# Patient Record
Sex: Female | Born: 1970 | Race: White | Hispanic: No | Marital: Married | Smoking: Former smoker
Health system: Southern US, Community
[De-identification: ages and names within clinical notes are randomized; demographics above are authoritative.]

## PROBLEM LIST (undated history)

## (undated) DIAGNOSIS — D229 Melanocytic nevi, unspecified: Secondary | ICD-10-CM

## (undated) DIAGNOSIS — K589 Irritable bowel syndrome without diarrhea: Secondary | ICD-10-CM

## (undated) DIAGNOSIS — J020 Streptococcal pharyngitis: Secondary | ICD-10-CM

## (undated) HISTORY — DX: Irritable bowel syndrome without diarrhea: K58.9

## (undated) HISTORY — DX: Streptococcal pharyngitis: J02.0

---
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
## 2002-10-18 ENCOUNTER — Other Ambulatory Visit: Admission: RE | Admit: 2002-10-18 | Discharge: 2002-10-18 | Payer: Self-pay | Admitting: *Deleted

## 2003-07-12 ENCOUNTER — Encounter: Admission: RE | Admit: 2003-07-12 | Discharge: 2003-07-12 | Payer: Self-pay | Admitting: *Deleted

## 2003-07-14 ENCOUNTER — Inpatient Hospital Stay (HOSPITAL_COMMUNITY): Admission: AD | Admit: 2003-07-14 | Discharge: 2003-07-14 | Payer: Self-pay | Admitting: *Deleted

## 2003-08-31 ENCOUNTER — Inpatient Hospital Stay (HOSPITAL_COMMUNITY): Admission: AD | Admit: 2003-08-31 | Discharge: 2003-09-01 | Payer: Self-pay | Admitting: Obstetrics and Gynecology

## 2003-09-02 ENCOUNTER — Inpatient Hospital Stay (HOSPITAL_COMMUNITY): Admission: AD | Admit: 2003-09-02 | Discharge: 2003-09-02 | Payer: Self-pay | Admitting: Obstetrics and Gynecology

## 2003-09-05 ENCOUNTER — Inpatient Hospital Stay (HOSPITAL_COMMUNITY): Admission: AD | Admit: 2003-09-05 | Discharge: 2003-09-05 | Payer: Self-pay | Admitting: Obstetrics and Gynecology

## 2003-09-07 ENCOUNTER — Inpatient Hospital Stay (HOSPITAL_COMMUNITY): Admission: AD | Admit: 2003-09-07 | Discharge: 2003-09-11 | Payer: Self-pay | Admitting: Obstetrics and Gynecology

## 2003-09-08 ENCOUNTER — Encounter (INDEPENDENT_AMBULATORY_CARE_PROVIDER_SITE_OTHER): Payer: Self-pay | Admitting: Specialist

## 2003-09-13 ENCOUNTER — Encounter: Admission: RE | Admit: 2003-09-13 | Discharge: 2003-10-13 | Payer: Self-pay | Admitting: Obstetrics and Gynecology

## 2003-10-17 ENCOUNTER — Other Ambulatory Visit: Admission: RE | Admit: 2003-10-17 | Discharge: 2003-10-17 | Payer: Self-pay | Admitting: Obstetrics and Gynecology

## 2004-01-04 DIAGNOSIS — D229 Melanocytic nevi, unspecified: Secondary | ICD-10-CM

## 2004-01-04 HISTORY — DX: Melanocytic nevi, unspecified: D22.9

## 2004-12-25 ENCOUNTER — Ambulatory Visit: Payer: Self-pay | Admitting: Internal Medicine

## 2005-01-01 ENCOUNTER — Ambulatory Visit: Payer: Self-pay | Admitting: Internal Medicine

## 2005-01-17 ENCOUNTER — Other Ambulatory Visit: Admission: RE | Admit: 2005-01-17 | Discharge: 2005-01-17 | Payer: Self-pay | Admitting: Obstetrics and Gynecology

## 2005-12-05 ENCOUNTER — Encounter: Admission: RE | Admit: 2005-12-05 | Discharge: 2005-12-05 | Payer: Self-pay | Admitting: Obstetrics and Gynecology

## 2006-01-02 ENCOUNTER — Inpatient Hospital Stay (HOSPITAL_COMMUNITY): Admission: AD | Admit: 2006-01-02 | Discharge: 2006-01-02 | Payer: Self-pay | Admitting: Obstetrics & Gynecology

## 2006-02-07 ENCOUNTER — Inpatient Hospital Stay (HOSPITAL_COMMUNITY): Admission: AD | Admit: 2006-02-07 | Discharge: 2006-02-09 | Payer: Self-pay | Admitting: Obstetrics and Gynecology

## 2006-05-26 ENCOUNTER — Ambulatory Visit: Payer: Self-pay | Admitting: Internal Medicine

## 2007-02-07 ENCOUNTER — Encounter: Payer: Self-pay | Admitting: Internal Medicine

## 2007-02-07 DIAGNOSIS — K589 Irritable bowel syndrome without diarrhea: Secondary | ICD-10-CM

## 2007-02-07 HISTORY — DX: Irritable bowel syndrome, unspecified: K58.9

## 2007-12-17 IMAGING — US US FETAL BPP W/O NONSTRESS
1 series · 14 of 26 positions shown · non-contrast
Comparison: none

CLINICAL DATA: 33.2 weeks pregnant, contractions, fetal deceleration, abnormal NST in office.

[Series 1: us fetal bpp w/o nonstress · 0.35mm/px · 14 of 26 slices shown]
[im 1/26]
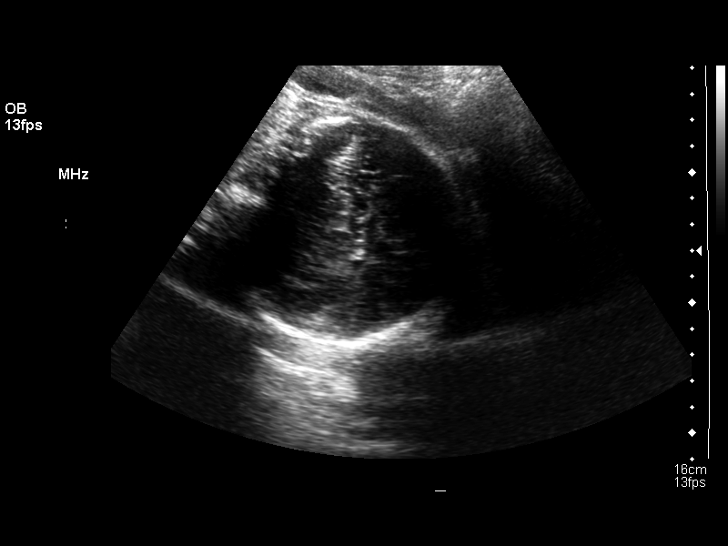
[im 3/26]
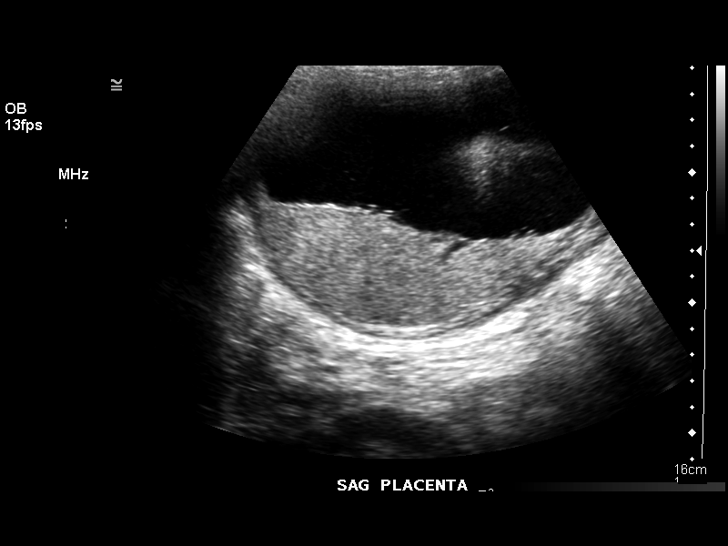
[im 5/26]
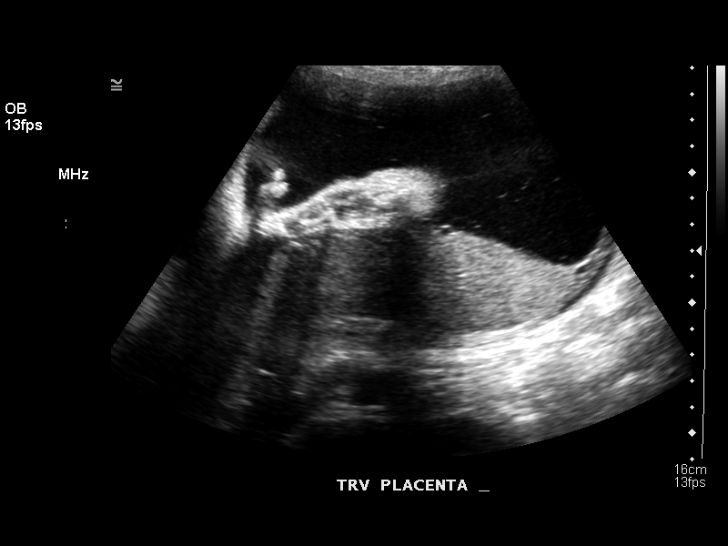
[im 7/26]
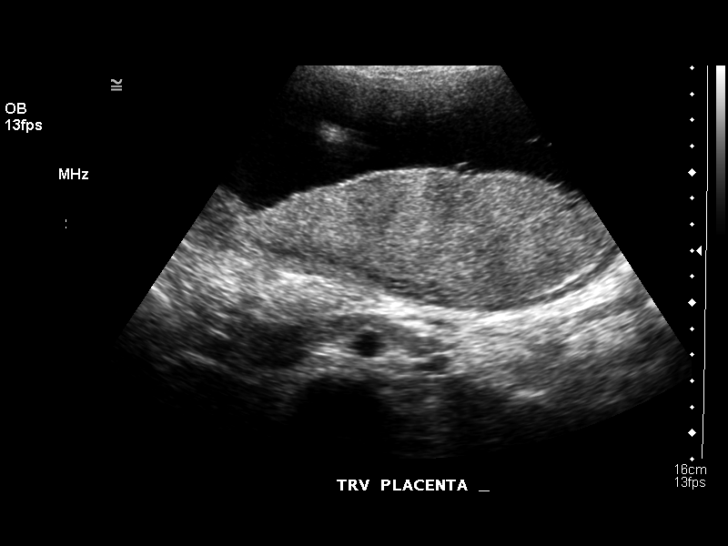
[im 9/26]
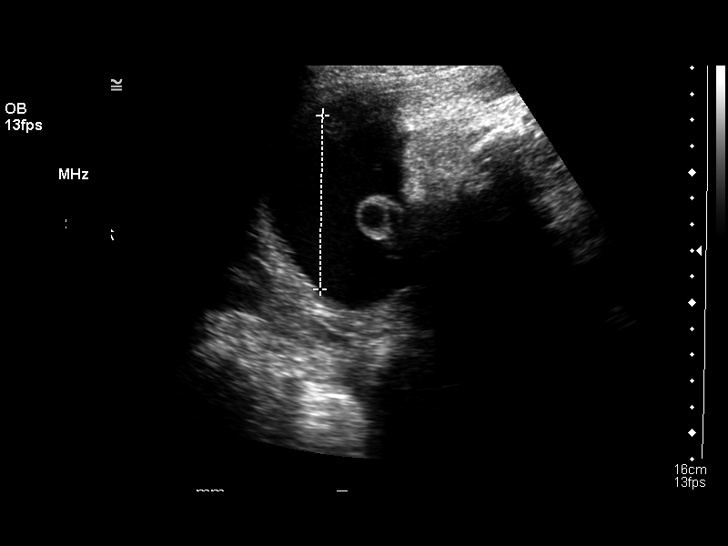
[im 11/26]
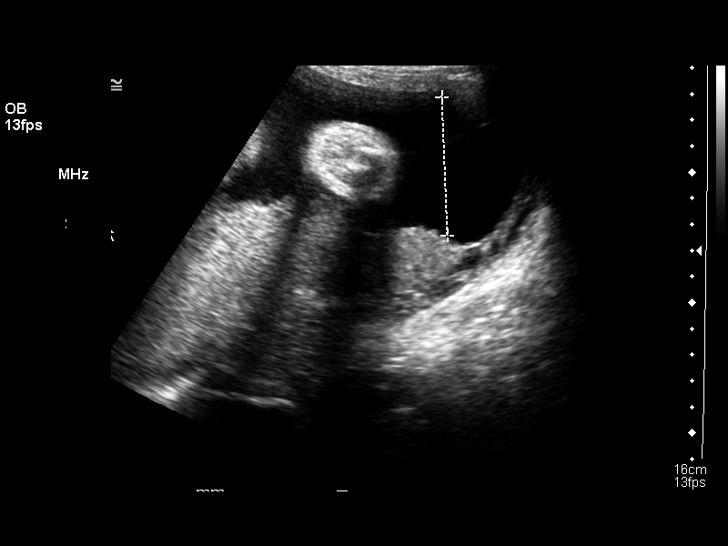
[im 13/26]
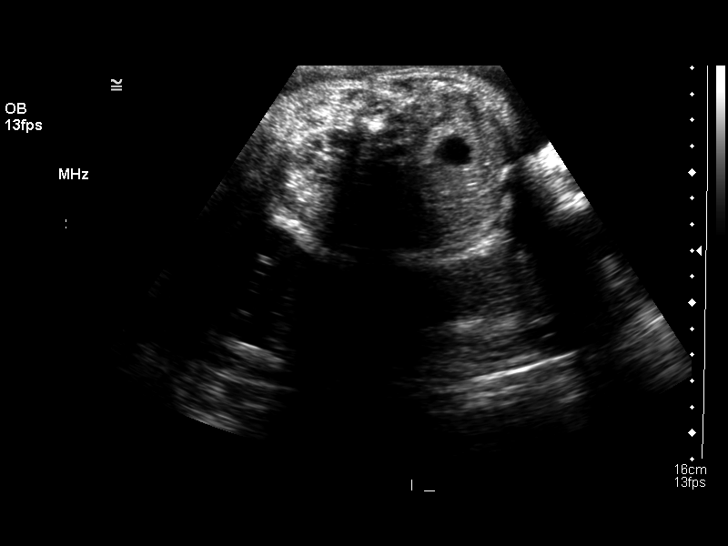
[im 14/26]
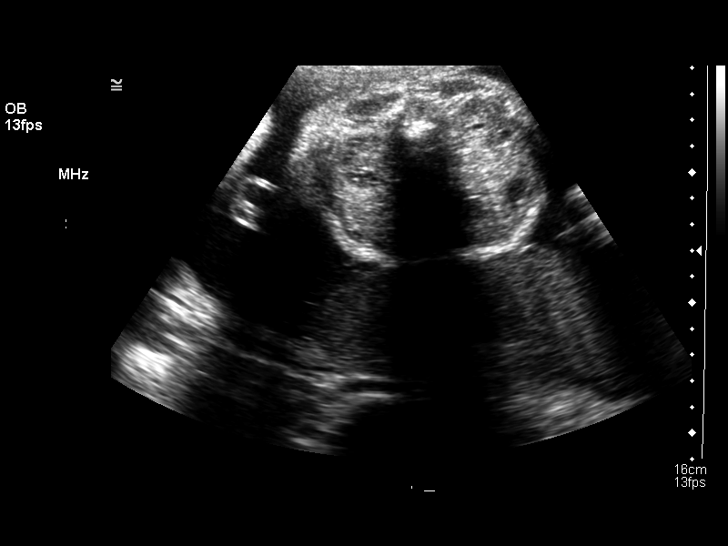
[im 16/26]
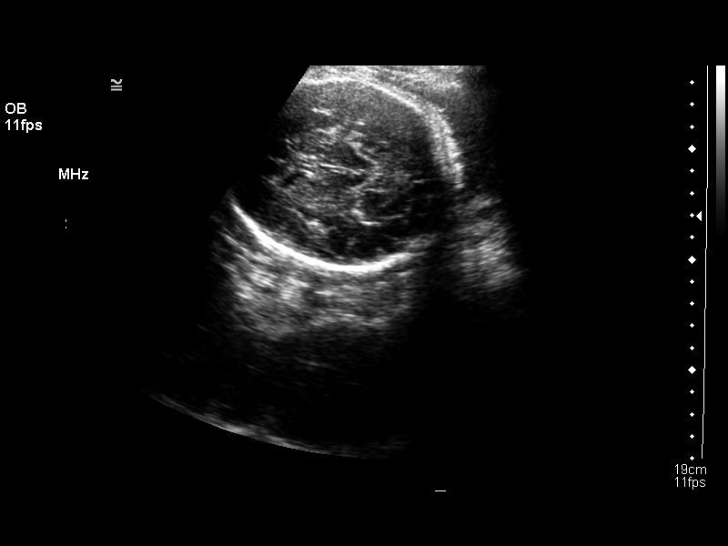
[im 18/26]
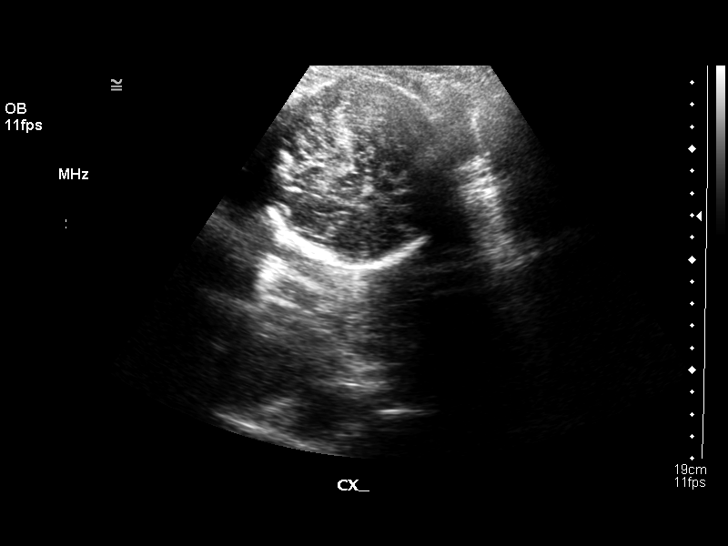
[im 20/26]
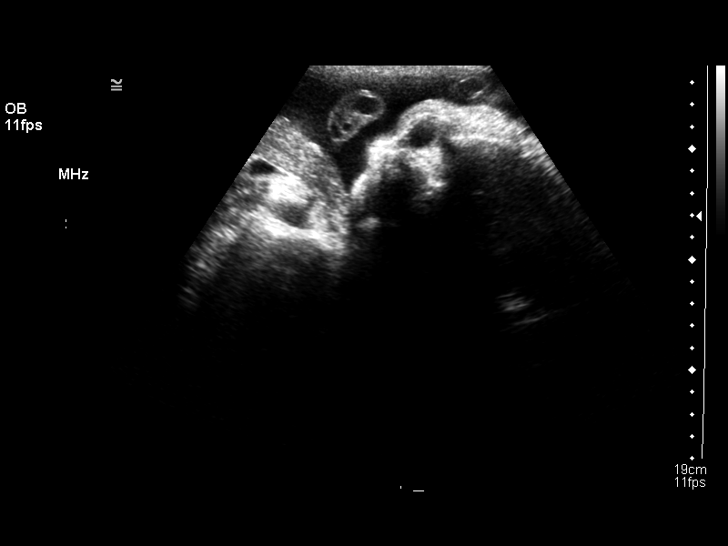
[im 22/26]
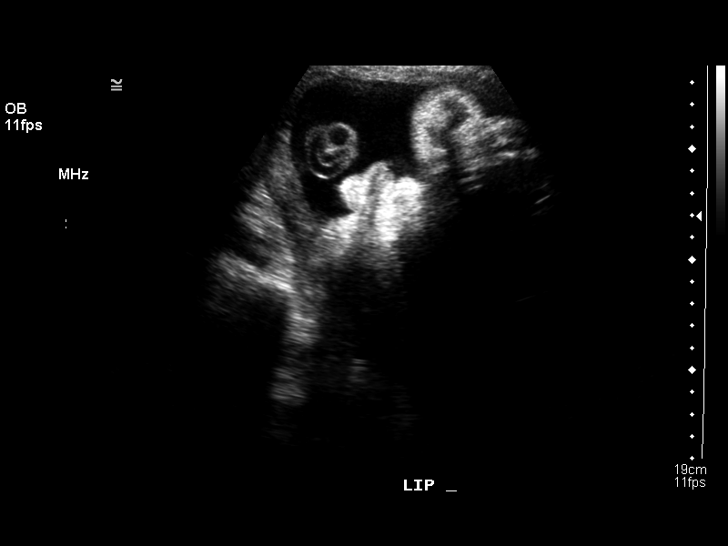
[im 24/26]
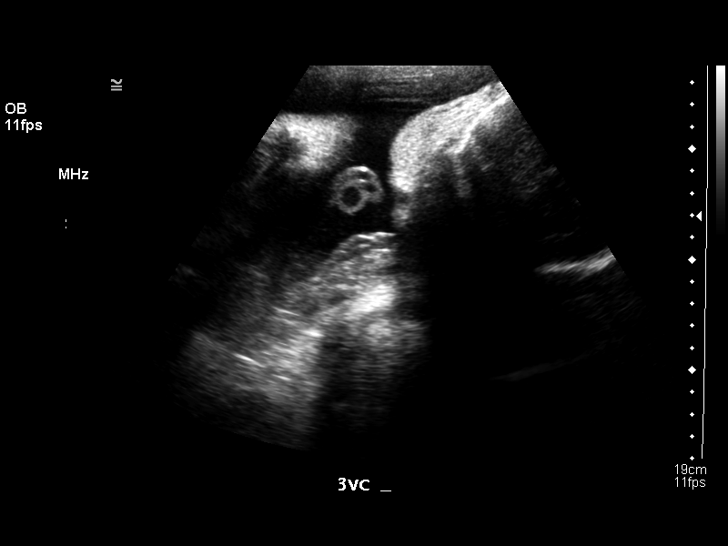
[im 26/26]
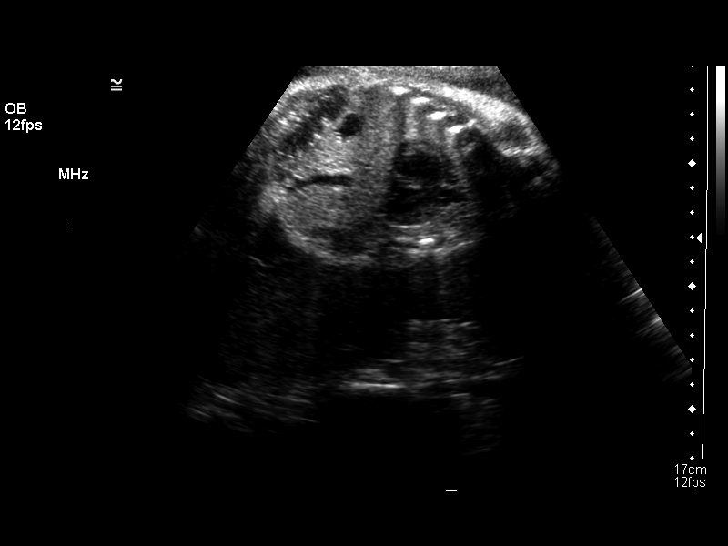

[14 of 26 positions shown; findings below may reference images not displayed]

BIOPHYSICAL PROFILE

 Number of Fetuses:  1
 Heart rate:  150 bpm
 Movement:  Yes
 Breathing:  Yes
 Presentation:  Cephalic
 Placental Location:  Posterior
 Grade:  I
 Previa:  No
 Amniotic Fluid (Subjective):  Normal
 Amniotic Fluid (Objective): 21.8 cm AFI (6th-26th%ile = 8.3 to 24.5 cm) for 33 weeks 

 Fetal measurements and complete anatomic evaluation were not requested.  The following fetal anatomy was visualized on this exam:  Lateral ventricles, thalami, stomach, 3 vessel cord, kidneys, bladder, upper lip, orbits, and diaphragm.

 BPP SCORING
 Movements:  2  20 minutes
 Breathing:  2
 Tone:  2
 Amniotic Fluid:  2
 Total Score:  8

 MATERNAL UTERINE AND ADNEXAL FINDINGS
 Cervix: 3.7 cm transabdominally
IMPRESSION: 1.  Single live intrauterine gestation in cephalic presentation with upper normal amniotic fluid volume.  Posterior placenta without previa.
 2.  Cervix closed 3.7 cm length.
 3.  Biophysical profile score of [DATE] in 20 minutes. 
 4.  Fetal measurements and anatomic survey not requested, not performed.

## 2008-05-02 ENCOUNTER — Telehealth: Payer: Self-pay | Admitting: Internal Medicine

## 2008-05-02 ENCOUNTER — Ambulatory Visit: Payer: Self-pay | Admitting: Internal Medicine

## 2008-05-09 ENCOUNTER — Telehealth: Payer: Self-pay | Admitting: Internal Medicine

## 2008-08-12 ENCOUNTER — Encounter: Admission: RE | Admit: 2008-08-12 | Discharge: 2008-08-12 | Payer: Self-pay | Admitting: Obstetrics and Gynecology

## 2009-03-27 ENCOUNTER — Ambulatory Visit: Payer: Self-pay | Admitting: Internal Medicine

## 2009-03-27 DIAGNOSIS — J020 Streptococcal pharyngitis: Secondary | ICD-10-CM | POA: Insufficient documentation

## 2009-03-27 HISTORY — DX: Streptococcal pharyngitis: J02.0

## 2010-03-20 ENCOUNTER — Encounter
Admission: RE | Admit: 2010-03-20 | Discharge: 2010-03-20 | Payer: Self-pay | Source: Home / Self Care | Attending: Obstetrics and Gynecology | Admitting: Obstetrics and Gynecology

## 2010-08-24 NOTE — Discharge Summary (Signed)
NAME:  Heidi Lamb, Heidi Lamb                            ACCOUNT NO.:  [id number]   MEDICAL RECORD NO.:  [id number]                   PATIENT TYPE:  INP   LOCATION:  9128                                 FACILITY:  [facility]   PHYSICIAN:  Duke Salvia. Marcelle Overlie, M.D.            DATE OF BIRTH:  May 22, 1970   DATE OF ADMISSION:  09/07/2003  DATE OF DISCHARGE:  09/11/2003                                 DISCHARGE SUMMARY   ADMISSION DIAGNOSES:  1. Intrauterine pregnancy at 38-1/2 weeks estimated gestational age.  2. Preeclampsia.  3. Gestational diabetes, diet controlled.   DISCHARGE DIAGNOSES:  1. Status post low transverse cesarean section secondary to arrested descent     with failed vacuum extractor.  2. Viable female infant.   PROCEDURES:  Primary low transverse cesarean section.   REASON FOR ADMISSION:  Please see written H&P.   HOSPITAL COURSE:  The patient is a 40 year old white married female, gravida  1, para 0, who is admitted to [facility] at 38-1/2 weeks  estimated gestational age for an induction of labor.  The patient had been  seen in the office and was noted to have blood pressures of 140-150/90.  The  patient denied headache or blurred vision or right upper quadrant pain.  The  patient had also had gestational diabetes which had been well controlled  with diet.  PIH labs were within normal limits with the exception of uric  acid which was 7.8.  On admission, vital signs were stable.  Fetal heart  tones were 140 with nice accelerations.  Contractions are every 5-10 minutes  which were mild deep tendon reflexes of 2+.  Cervix was dilated 3 cm, 50%  effaced.  Vertex had a -3 station.  Artificial rupture of membranes was  performed which revealed light meconium stained fluid.  Internal pressure  catheter and fetal scalp electrode were placed.  Epidural was administered  for patient's comfort.  Magnesium sulfate was started.  The patient  progressed nicely to complete  dilation.  The patient did then begin to push,  which she pushed for approximately 1-1/2 to 2 hours.  She did have good  effort but became exhausted.  Vertex was noted to be left occipital anterior  at a +3 station with some capit formation.  Decision was made to assist the  patient with a vacuum extractor.  Bladder was catheterized and __________  vacuum extractor was placed.  Over the course of the next five contractions,  there was very little change noted.  Decision was made to discontinue with  vacuum extractor, and decision was made to proceed with cesarean delivery.  The patient was then taken to the operating room where epidural was dosed to  an adequate surgical level.  A low transverse incision was made with  delivery of a viable female infant weighing 7 pounds 12 ounces with Apgars  of 9 at 1 minute and  9 at 5 minutes.  Umbilical cord pH was 7.25.  The  patient tolerated the procedure well and was taken to the recovery room in  stable condition.  The patient was later transferred to the AICU where she  continued on magnesium sulfate.  On postoperative day #1, vital signs were  stable.  She was afebrile.  Abdomen was soft with good return of bowel  function.  Fundus was firm and nontender.  Abdominal dressing was noted to  be clean, dry and intact.  On postoperative day #2, patient was doing well.  Vital signs were stable.  Blood pressure 126/79.  Abdominal dressing had  been removed revealing incision that was clean, dry and intact.  Labs  revealed hemoglobin of 11.8.  On postoperative day #3, the patient's vital  signs were stable.  She was afebrile.  Blood pressure 118/74.  Incision was  clean, dry and intact.  Staples were removed.  The patient was discharged  home.   CONDITION ON DISCHARGE:  Good.   DIET:  Regular, as tolerated.   ACTIVITY:  No heavy lifting.  No driving x2 weeks.  No vaginal entry.   FOLLOWUP:  The patient is to follow up in the office in 1-2 weeks for  an  incision check.  She is to call with temperature greater than 100 degrees,  persistent nausea, vomiting and heavy vaginal bleeding and/or redness or  drainage from the incisional site.   DISCHARGE MEDICATIONS:  1. Tylox #30 1 q.4-6h. p.r.n.  2. Motrin 600 mg q.6h.  3. Prenatal vitamins 1 p.o. daily.  4. Colace 1 p.o. daily.     Julio Sicks, N.P.                        Richard M. Marcelle Overlie, M.D.    CC/MEDQ  D:  10/07/2003  T:  10/07/2003  Job:  [id number]

## 2010-08-24 NOTE — Op Note (Signed)
NAME:  Heidi Lamb, Heidi Lamb                            ACCOUNT NO.:  [id number]   MEDICAL RECORD NO.:  [id number]                   PATIENT TYPE:  INP   LOCATION:  9175                                 FACILITY:  [facility]   PHYSICIAN:  Guy Sandifer. Arleta Creek, M.D.           DATE OF BIRTH:  1970/08/10   DATE OF PROCEDURE:  09/08/2003  DATE OF DISCHARGE:                                 OPERATIVE REPORT   PREOPERATIVE DIAGNOSIS:  1. Intrauterine pregnancy at 50 1/[redacted] weeks gestational age.  2. Pre-eclampsia.  3. Arrest of descent.   POSTOPERATIVE DIAGNOSIS:  1. Intrauterine pregnancy at 32 1/[redacted] weeks gestational age.  2. Pre-eclampsia.  3. Arrest of descent.   PROCEDURE:  Low transverse cesarean section.   SURGEON:  Guy Sandifer. Henderson Cloud, M.D.   ANESTHESIA:  Epidural.   ANESTHESIOLOGIST:  Octaviano Glow. Pamalee Leyden, M.D.   ESTIMATED BLOOD LOSS:  800 mL.   SPECIMENS:  Placenta.   FINDINGS:  Viable female infant, Apgars 9 and 9 at 1 and 5 minutes  respectively, birth weight 7 pounds 12 ounces.  Arterial cord pH 7.25.   INDICATIONS AND CONSENT:  This patient is a 40 year old married white  female, G1, P0, at 6 1/2 weeks, who has had a history of gestational  diabetes with good diet control.  She is admitted with elevated blood  pressures on September 07, 2003.  Laboratories are essentially normal except for  an elevated uric acid at approximately 7.8.  On the morning of September 08, 2003,  blood pressures are 150s over 90s.  Fetal heart tones are reactive.  Cervix  is 3 cm dilated, 90% effaced, -1 station of vertex.  Artificial rupture of  membranes for light meconium was carried out.  Internal uterine catheter and  fetal scalp electrode are placed.  Repeat laboratories are essentially  unchanged.  The patient progresses to complete and pushing and pushes for 1  1/2 to 2 hours.  She has good effort but is exhausted.  The vertex is LOA,  acyclitic, with caput formation and a +3 station.  Options were discussed  with the  patient and her husband.  The patient requests assistance with  vacuum extraction.  1 in 40,000 risks of severe morbidity and mortality is  reviewed.  The bladder is straight catheterized and a Kiwi vacuum extraction  is placed.  Over the course of five contractions, there is very little  change noted and no sustained change.  There are two pop-offs.  The vacuum  extraction is then discontinued.  Recommendation for cesarean section is  made and potential risks and complications are discussed with the patient  and husband including but not limited to infection, bowel, bladder, or  ureteral damage, bleeding requiring transfusion of blood products and  possible transfusion reaction, HIV, and hepatitis acquisition, DVT, PE, and  pneumonia.  All questions are answered and consent is signed on the chart.  DESCRIPTION OF PROCEDURE:  The patient was taken to the operating room she  was identified and her epidural is augmented to a surgical level.  She is  placed in a dorsal supine position with a 15 degree left lateral wedge.  She  is prepped abdominally, Foley catheter placed in the bladder, and she was  draped in a sterile fashion.  After testing for adequate epidural incision,  the skin was entered through a Pfannenstiel skin incision and dissection was  carried out in layers to the peritoneum.  The peritoneum was entered and  extended superiorly and inferiorly.  The vesicouterine peritoneum was taken  down and the bladder flap was developed and the bladder blade was placed.  The uterus was incised in a low transverse manner and the uterine cavity was  entered bluntly with a hemostat.  The uterine incision was extended  cephalolaterally with the fingers.  The vertex is in the LOA position.  It  is then delivered.  DeLee suction of the oropharynx and nasopharynx is  carried out.  The baby is then delivered and a good cry and tone was noted.  The cord was clamped and cut and the baby was handed  to the awaiting  pediatrics team.  The placenta was manually delivered and sent to pathology.  The uterine cavity was cleaned.  The uterus was closed using a running  locking layer of 0 Monocryl which achieves good hemostasis.  The tubes and  ovaries palpate normally bilaterally.  The anterior peritoneum was closed in  a running fashion with 0 Monocryl suture which was also used to  reapproximate the pyramidalis muscle in the midline.  The anterior rectus  fascia was closed in a running fashion with 0 PDS suture the skin was closed  with clips.  All sponge, instrument, and needle counts were correct.  The  patient was transferred to the recovery room in stable condition.                                               Guy Sandifer Arleta Creek, M.D.    JET/MEDQ  D:  09/08/2003  T:  09/08/2003  Job:  [id number]

## 2010-08-24 NOTE — Op Note (Signed)
NAME:  Heidi Lamb, Heidi Lamb                  ACCOUNT NO.:  [id number]   MEDICAL RECORD NO.:  [id number]          PATIENT TYPE:  INP   LOCATION:  9148                          [facility]ILITY:  [facility]   PHYSICIAN:  Guy Sandifer. Henderson Cloud, M.D. DATE OF BIRTH:  December 16, 1970   DATE OF PROCEDURE:  02/07/2006  DATE OF DISCHARGE:                                 OPERATIVE REPORT   PROCEDURE:  Vacuum extraction.   INDICATIONS AND CONSENT:  The patient is a 40 year old married white female  G3, P1, at 38-4/7 weeks, who presents with spontaneous rupture of membrane  in labor.  Prenatal care was complicated by previous low transverse cesarean  section for failure to progress.  Issues of and risks of VBAC versus repeat  C-section have been discussed with the patient in the office.  The patient  progresses to complete and pushing.  She pushed for approximately 3 1/2  hours.  Baby is OA +3 station.  Fetal heart tones were reactive and there  are some deep decels with some pushes.  Options are discussed with the  patient.  Vacuum extraction, possible risks of shoulder dystocia, versus  cesarean section is reviewed with the patient.  She requests a trial vacuum  extraction.  The risks were reviewed.  The bladder has been straight  catheterized.  The patient is set up.  Kiwi vacuum extractor was placed and  under steady progress with one pop-off, the vertex delivers.  There is a  nuchal cord x3 that is reduced.  The shoulder is then easily delivered  without difficulty.  Viable female infant, Apgars of 8/9 at 1 and 5 minutes  respectively is obtained.  Arterial cord pH is 7.17 is noted and birth  weight is pending.  Placenta is three vessels intact.  Estimated blood loss  is 500 mL.  Cervix and vagina without laceration.  Second degree midline  episiotomy is repaired.  The patient and her infant are stable in labor and  delivery room.      Guy Sandifer Henderson Cloud, M.D.  Electronically Signed     JET/MEDQ  D:  02/07/2006  T:   02/08/2006  Job:  [id number]

## 2011-08-09 ENCOUNTER — Encounter: Payer: Self-pay | Admitting: Internal Medicine

## 2011-09-09 ENCOUNTER — Ambulatory Visit (INDEPENDENT_AMBULATORY_CARE_PROVIDER_SITE_OTHER): Payer: Managed Care, Other (non HMO) | Admitting: Internal Medicine

## 2011-09-09 ENCOUNTER — Encounter: Payer: Self-pay | Admitting: Internal Medicine

## 2011-09-09 ENCOUNTER — Other Ambulatory Visit (INDEPENDENT_AMBULATORY_CARE_PROVIDER_SITE_OTHER): Payer: Managed Care, Other (non HMO)

## 2011-09-09 VITALS — BP 122/70 | HR 78 | Temp 98.5°F | Resp 16 | Wt 135.0 lb

## 2011-09-09 DIAGNOSIS — O24419 Gestational diabetes mellitus in pregnancy, unspecified control: Secondary | ICD-10-CM

## 2011-09-09 DIAGNOSIS — Z0000 Encounter for general adult medical examination without abnormal findings: Secondary | ICD-10-CM | POA: Insufficient documentation

## 2011-09-09 DIAGNOSIS — Z8632 Personal history of gestational diabetes: Secondary | ICD-10-CM | POA: Insufficient documentation

## 2011-09-09 DIAGNOSIS — O99810 Abnormal glucose complicating pregnancy: Secondary | ICD-10-CM

## 2011-09-09 DIAGNOSIS — K589 Irritable bowel syndrome without diarrhea: Secondary | ICD-10-CM

## 2011-09-09 LAB — COMPREHENSIVE METABOLIC PANEL
ALT: 16 U/L (ref 0–35)
AST: 20 U/L (ref 0–37)
Albumin: 4.4 g/dL (ref 3.5–5.2)
CO2: 26 mEq/L (ref 19–32)
GFR: 120.68 mL/min (ref >60.00)
Glucose, Bld: 112 mg/dL — ABNORMAL HIGH (ref 70–99)
Potassium: 4.3 mEq/L (ref 3.5–5.1)
Total Bilirubin: 0.5 mg/dL (ref 0.3–1.2)
Total Protein: 7 g/dL (ref 6.0–8.3)

## 2011-09-09 LAB — LIPID PANEL
HDL: 46.3 mg/dL (ref >39.00)
Total CHOL/HDL Ratio: 3

## 2011-09-09 NOTE — Assessment & Plan Note (Signed)
Serum glucose is normal and A1C is normal - no indication of glucose metabolism problems.  Plan  Continue on a healthy diet.   Recheck A1C in 5 years, sooner if there are symptoms

## 2011-09-09 NOTE — Assessment & Plan Note (Signed)
Possible h/o IBS symptoms but she is now asymptomatic: regular bowel habit with no report of bloating, gas, borborygmi or other symptoms  Plan Continue high fiber, vegan diet.

## 2011-09-09 NOTE — Progress Notes (Signed)
Subjective:    Patient ID: Heidi Lamb, female    DOB: 08-15-1970, 41 y.o.   MRN: [id number]  HPI Heidi Lamb presents for a wellness exam with her last IM exam being 7 years ago. In the interval she has had an abnormal PAP and is followed on an annual basis by Heidi Lamb. She had a uneventful c-sectio in '05; a normal vaginal delivery ' 08. She has also had abdominal pain that was thought to be c/w IBS.  Past Medical History  Diagnosis Date  . Irritable bowel syndrome 02/07/2007    Qualifier: Diagnosis of  By: Charlsie Quest RMA, Lucy    . STREP THROAT 03/27/2009    Qualifier: Diagnosis of  By: Yetta Barre MD, Bernadene Bell.    Past Surgical History  Procedure Date  . Cesarean section    Family History  Problem Relation Age of Onset  . Coronary artery disease Neg Hx   . Colon cancer Neg Hx   . Breast cancer Neg Hx   . Diabetes Neg Hx    History   Social History  . Marital Status: Married    Spouse Name: N/A    Number of Children: 2  . Years of Education: 16   Occupational History  .  [employer]   Social History Main Topics  . Smoking status: Former Games developer  . Smokeless tobacco: Never Used  . Alcohol Use: No  . Drug Use: No  . Sexually Active: Yes -- Female partner(s)   Other Topics Concern  . Not on file   Social History Narrative   [employer] [place]. Married '96, 2 children: '05, '08. Work: [employer]. Marriage in good health and family is healthy. She has adopted a vegan diet and is doing great, she has lost 12-14 lbs (June '13).       Review of Systems Constitutional:  Negative for fever, chills, activity change and unexpected weight change.  HEENT:  Negative for hearing loss, ear pain, congestion, neck stiffness and postnasal drip. Negative for sore throat or swallowing problems. Negative for dental complaints.   Eyes: Negative for vision loss or change in visual acuity.  Respiratory: Negative for chest tightness and wheezing. Negative for DOE.     Cardiovascular: Negative for chest pain or palpitations. No decreased exercise tolerance Gastrointestinal: No change in bowel habit. No bloating or gas. No reflux or indigestion Genitourinary: Negative for urgency, frequency, flank pain and difficulty urinating.  Musculoskeletal: Negative for myalgias, back pain, arthralgias and gait problem.  Neurological: Negative for dizziness, tremors, weakness and headaches.  Hematological: Negative for adenopathy.  Psychiatric/Behavioral: Negative for behavioral problems and dysphoric mood.       Objective:   Physical Exam w/ assistance from Koren Bound, MSIII Filed Vitals:   09/09/11 0915  BP: 122/70  Pulse: 78  Temp: 98.5 F (36.9 C)  Resp: 16   Wt Readings from Last 3 Encounters:  09/09/11 135 lb (61.236 kg)  03/27/09 126 lb (57.153 kg)  05/26/06 127 lb (57.607 kg)    Gen'l: well nourished, well developed white woman in no distress HEENT - [place]/AT, EACs/TMs normal, oropharynx with native dentition in good condition, no buccal or palatal lesions, posterior pharynx clear, mucous membranes moist. C&S clear, PERRLA, fundi - normal Neck - supple, no thyromegaly Nodes- negative submental, cervical, supraclavicular regions Chest - no deformity, no CVAT Lungs - cleat without rales, wheezes. No increased work of breathing Breast - deferred to gyn Cardiovascular - regular rate and  rhythm, quiet precordium, no murmurs, rubs or gallops, 2+ radial, DP and PT pulses Abdomen - BS+ x 4, no HSM, no guarding or rebound or tenderness Pelvic - deferred to gyn Rectal - deferred to gyn Extremities - no clubbing, cyanosis, edema or deformity.  Neuro - A&O x 3, CN II-XII normal, motor strength normal and equal, DTRs 2+ and symmetrical biceps, radial, and patellar tendons. Cerebellar - no tremor, no rigidity, fluid movement and normal gait. Derm - Head, neck, back, abdomen and extremities without suspicious lesions  Lab Results  Component Value Date    GLUCOSE 112* 09/09/2011   CHOL 137 09/09/2011   TRIG 187.0* 09/09/2011   HDL 46.30 09/09/2011   LDLCALC 53 09/09/2011   ALT 16 09/09/2011   AST 20 09/09/2011   NA 138 09/09/2011   K 4.3 09/09/2011   CL 103 09/09/2011   CREATININE 0.6 09/09/2011   BUN 7 09/09/2011   CO2 26 09/09/2011   HGBA1C 5.4 09/09/2011          Assessment & Plan:

## 2011-09-09 NOTE — Assessment & Plan Note (Signed)
Interval medical history is unremarkable. Physical exam, sans breast and pelvic exam, is normal. Lab results are normal including A1C. She is current with her gynecologist for cervical and breast cancer screening.  Immunizations - she should have Tdap if not given a tetanus booster in the last 5 years.   In summary - a delightful and healthy woman making an investment in her health with a vegan diet and regular exercise. She is advised to keep up the GOOD WORK! Follow-up exam is recommended in 3 years. She will return sooner on an as needed basis.

## 2011-09-15 ENCOUNTER — Encounter: Payer: Self-pay | Admitting: Internal Medicine

## 2011-09-25 ENCOUNTER — Encounter: Payer: Self-pay | Admitting: Internal Medicine

## 2014-04-07 ENCOUNTER — Ambulatory Visit: Payer: Managed Care, Other (non HMO) | Admitting: Internal Medicine

## 2014-04-12 ENCOUNTER — Ambulatory Visit: Payer: Managed Care, Other (non HMO) | Admitting: Internal Medicine

## 2014-04-25 ENCOUNTER — Ambulatory Visit: Payer: Managed Care, Other (non HMO) | Admitting: Internal Medicine

## 2014-04-29 ENCOUNTER — Ambulatory Visit: Payer: Managed Care, Other (non HMO) | Admitting: Internal Medicine

## 2014-05-04 ENCOUNTER — Ambulatory Visit (INDEPENDENT_AMBULATORY_CARE_PROVIDER_SITE_OTHER): Payer: BLUE CROSS/BLUE SHIELD | Admitting: Internal Medicine

## 2014-05-04 ENCOUNTER — Encounter: Payer: Self-pay | Admitting: Internal Medicine

## 2014-05-04 VITALS — BP 118/66 | HR 64 | Temp 98.4°F | Resp 12 | Ht 64.0 in | Wt 140.0 lb

## 2014-05-04 DIAGNOSIS — K589 Irritable bowel syndrome without diarrhea: Secondary | ICD-10-CM

## 2014-05-04 DIAGNOSIS — Z0000 Encounter for general adult medical examination without abnormal findings: Secondary | ICD-10-CM

## 2014-05-04 DIAGNOSIS — Z8632 Personal history of gestational diabetes: Secondary | ICD-10-CM

## 2014-05-04 NOTE — Patient Instructions (Signed)
We will check the blood work today and call you back with the results.   We will see you back in 1-2 years. If you have any problems or questions before then please feel free to call the office.   Keep up the good work by staying active and watching your diet.  Health Maintenance Adopting a healthy lifestyle and getting preventive care can go a long way to promote health and wellness. Talk with your health care provider about what schedule of regular examinations is right for you. This is a good chance for you to check in with your provider about disease prevention and staying healthy. In between checkups, there are plenty of things you can do on your own. Experts have done a lot of research about which lifestyle changes and preventive measures are most likely to keep you healthy. Ask your health care provider for more information. WEIGHT AND DIET  Eat a healthy diet  Be sure to include plenty of vegetables, fruits, low-fat dairy products, and lean protein.  Do not eat a lot of foods high in solid fats, added sugars, or salt.  Get regular exercise. This is one of the most important things you can do for your health.  Most adults should exercise for at least 150 minutes each week. The exercise should increase your heart rate and make you sweat (moderate-intensity exercise).  Most adults should also do strengthening exercises at least twice a week. This is in addition to the moderate-intensity exercise.  Maintain a healthy weight  Body mass index (BMI) is a measurement that can be used to identify possible weight problems. It estimates body fat based on height and weight. Your health care provider can help determine your BMI and help you achieve or maintain a healthy weight.  For females 70 years of age and older:   A BMI below 18.5 is considered underweight.  A BMI of 18.5 to 24.9 is normal.  A BMI of 25 to 29.9 is considered overweight.  A BMI of 30 and above is considered obese.   Watch levels of cholesterol and blood lipids  You should start having your blood tested for lipids and cholesterol at 46 years of age, then have this test every 5 years.  You may need to have your cholesterol levels checked more often if:  Your lipid or cholesterol levels are high.  You are older than 46 years of age.  You are at high risk for heart disease.  CANCER SCREENING   Lung Cancer  Lung cancer screening is recommended for adults 7-44 years old who are at high risk for lung cancer because of a history of smoking.  A yearly low-dose CT scan of the lungs is recommended for people who:  Currently smoke.  Have quit within the past 15 years.  Have at least a 30-pack-year history of smoking. A pack year is smoking an average of one pack of cigarettes a day for 1 year.  Yearly screening should continue until it has been 15 years since you quit.  Yearly screening should stop if you develop a health problem that would prevent you from having lung cancer treatment.  Breast Cancer  Practice breast self-awareness. This means understanding how your breasts normally appear and feel.  It also means doing regular breast self-exams. Let your health care provider know about any changes, no matter how small.  If you are in your 20s or 30s, you should have a clinical breast exam (CBE) by a health care  provider every 1-3 years as part of a regular health exam.  If you are 57 or older, have a CBE every year. Also consider having a breast X-ray (mammogram) every year.  If you have a family history of breast cancer, talk to your health care provider about genetic screening.  If you are at high risk for breast cancer, talk to your health care provider about having an MRI and a mammogram every year.  Breast cancer gene (BRCA) assessment is recommended for women who have family members with BRCA-related cancers. BRCA-related cancers  include:  Breast.  Ovarian.  Tubal.  Peritoneal cancers.  Results of the assessment will determine the need for genetic counseling and BRCA1 and BRCA2 testing. Cervical Cancer Routine pelvic examinations to screen for cervical cancer are no longer recommended for nonpregnant women who are considered low risk for cancer of the pelvic organs (ovaries, uterus, and vagina) and who do not have symptoms. A pelvic examination may be necessary if you have symptoms including those associated with pelvic infections. Ask your health care provider if a screening pelvic exam is right for you.   The Pap test is the screening test for cervical cancer for women who are considered at risk.  If you had a hysterectomy for a problem that was not cancer or a condition that could lead to cancer, then you no longer need Pap tests.  If you are older than 65 years, and you have had normal Pap tests for the past 10 years, you no longer need to have Pap tests.  If you have had past treatment for cervical cancer or a condition that could lead to cancer, you need Pap tests and screening for cancer for at least 20 years after your treatment.  If you no longer get a Pap test, assess your risk factors if they change (such as having a new sexual partner). This can affect whether you should start being screened again.  Some women have medical problems that increase their chance of getting cervical cancer. If this is the case for you, your health care provider may recommend more frequent screening and Pap tests.  The human papillomavirus (HPV) test is another test that may be used for cervical cancer screening. The HPV test looks for the virus that can cause cell changes in the cervix. The cells collected during the Pap test can be tested for HPV.  The HPV test can be used to screen women 24 years of age and older. Getting tested for HPV can extend the interval between normal Pap tests from three to five years.  An HPV  test also should be used to screen women of any age who have unclear Pap test results.  After 46 years of age, women should have HPV testing as often as Pap tests.  Colorectal Cancer  This type of cancer can be detected and often prevented.  Routine colorectal cancer screening usually begins at 46 years of age and continues through 46 years of age.  Your health care provider may recommend screening at an earlier age if you have risk factors for colon cancer.  Your health care provider may also recommend using home test kits to check for hidden blood in the stool.  A small camera at the end of a tube can be used to examine your colon directly (sigmoidoscopy or colonoscopy). This is done to check for the earliest forms of colorectal cancer.  Routine screening usually begins at age 22.  Direct examination of the colon  should be repeated every 5-10 years through 46 years of age. However, you may need to be screened more often if early forms of precancerous polyps or small growths are found. Skin Cancer  Check your skin from head to toe regularly.  Tell your health care provider about any new moles or changes in moles, especially if there is a change in a mole's shape or color.  Also tell your health care provider if you have a mole that is larger than the size of a pencil eraser.  Always use sunscreen. Apply sunscreen liberally and repeatedly throughout the day.  Protect yourself by wearing long sleeves, pants, a wide-brimmed hat, and sunglasses whenever you are outside. HEART DISEASE, DIABETES, AND HIGH BLOOD PRESSURE   Have your blood pressure checked at least every 1-2 years. High blood pressure causes heart disease and increases the risk of stroke.  If you are between 55 years and 79 years old, ask your health care provider if you should take aspirin to prevent strokes.  Have regular diabetes screenings. This involves taking a blood sample to check your fasting blood sugar  level.  If you are at a normal weight and have a low risk for diabetes, have this test once every three years after 45 years of age.  If you are overweight and have a high risk for diabetes, consider being tested at a younger age or more often. PREVENTING INFECTION  Hepatitis B  If you have a higher risk for hepatitis B, you should be screened for this virus. You are considered at high risk for hepatitis B if:  You were born in a country where hepatitis B is common. Ask your health care provider which countries are considered high risk.  Your parents were born in a high-risk country, and you have not been immunized against hepatitis B (hepatitis B vaccine).  You have HIV or AIDS.  You use needles to inject street drugs.  You live with someone who has hepatitis B.  You have had sex with someone who has hepatitis B.  You get hemodialysis treatment.  You take certain medicines for conditions, including cancer, organ transplantation, and autoimmune conditions. Hepatitis C  Blood testing is recommended for:  Everyone born from 1945 through 1965.  Anyone with known risk factors for hepatitis C. Sexually transmitted infections (STIs)  You should be screened for sexually transmitted infections (STIs) including gonorrhea and chlamydia if:  You are sexually active and are younger than 46 years of age.  You are older than 46 years of age and your health care provider tells you that you are at risk for this type of infection.  Your sexual activity has changed since you were last screened and you are at an increased risk for chlamydia or gonorrhea. Ask your health care provider if you are at risk.  If you do not have HIV, but are at risk, it may be recommended that you take a prescription medicine daily to prevent HIV infection. This is called pre-exposure prophylaxis (PrEP). You are considered at risk if:  You are sexually active and do not regularly use condoms or know the HIV status  of your partner(s).  You take drugs by injection.  You are sexually active with a partner who has HIV. Talk with your health care provider about whether you are at high risk of being infected with HIV. If you choose to begin PrEP, you should first be tested for HIV. You should then be tested every 3 months for   as long as you are taking PrEP.  PREGNANCY   If you are premenopausal and you may become pregnant, ask your health care provider about preconception counseling.  If you may become pregnant, take 400 to 800 micrograms (mcg) of folic acid every day.  If you want to prevent pregnancy, talk to your health care provider about birth control (contraception). OSTEOPOROSIS AND MENOPAUSE   Osteoporosis is a disease in which the bones lose minerals and strength with aging. This can result in serious bone fractures. Your risk for osteoporosis can be identified using a bone density scan.  If you are 65 years of age or older, or if you are at risk for osteoporosis and fractures, ask your health care provider if you should be screened.  Ask your health care provider whether you should take a calcium or vitamin D supplement to lower your risk for osteoporosis.  Menopause may have certain physical symptoms and risks.  Hormone replacement therapy may reduce some of these symptoms and risks. Talk to your health care provider about whether hormone replacement therapy is right for you.  HOME CARE INSTRUCTIONS   Schedule regular health, dental, and eye exams.  Stay current with your immunizations.   Do not use any tobacco products including cigarettes, chewing tobacco, or electronic cigarettes.  If you are pregnant, do not drink alcohol.  If you are breastfeeding, limit how much and how often you drink alcohol.  Limit alcohol intake to no more than 1 drink per day for nonpregnant women. One drink equals 12 ounces of beer, 5 ounces of wine, or 1 ounces of hard liquor.  Do not use street  drugs.  Do not share needles.  Ask your health care provider for help if you need support or information about quitting drugs.  Tell your health care provider if you often feel depressed.  Tell your health care provider if you have ever been abused or do not feel safe at home. Document Released: 10/08/2010 Document Revised: 08/09/2013 Document Reviewed: 02/24/2013 Exit[employer] Patient Information 2015 [employer]. This information is not intended to replace advice given to you by your health care provider. Make sure you discuss any questions you have with your health care provider.  

## 2014-05-04 NOTE — Progress Notes (Signed)
Pre visit review using our clinic review tool, if applicable. No additional management support is needed unless otherwise documented below in the visit note. 

## 2014-05-05 NOTE — Assessment & Plan Note (Signed)
Earlier in life had more symptoms. Now controlled without medications.

## 2014-05-05 NOTE — Assessment & Plan Note (Signed)
Check HgA1c today, no signs of diabetes at this time and will continue to check HgA1c yearly.

## 2014-05-05 NOTE — Progress Notes (Signed)
   Subjective:    Patient ID: Heidi Lamb, female    DOB: 1970/04/15, 44 y.o.   MRN: [id number]  HPI The patient is a 44 YO woman who is coming in for physical. She has no complaints. She does have PMH of gestational diabetes (not recurred out of pregnancy), IBS (more when she was younger, now stable off medications). Denies new problems, allergies.   Review of Systems  Constitutional: Negative for fever, activity change, appetite change, fatigue and unexpected weight change.  HENT: Negative.   Respiratory: Negative for cough, chest tightness, shortness of breath and wheezing.   Cardiovascular: Negative for chest pain, palpitations and leg swelling.  Gastrointestinal: Negative for nausea, abdominal pain, diarrhea, constipation and abdominal distention.  Endocrine: Negative.   Musculoskeletal: Negative.   Skin: Negative.   Neurological: Negative.   Psychiatric/Behavioral: Negative.   All other systems reviewed and are negative.     Objective:   Physical Exam  Constitutional: She is oriented to person, place, and time. She appears well-developed and well-nourished.  HENT:  Head: Normocephalic and atraumatic.  Eyes: EOM are normal.  Neck: Normal range of motion.  Cardiovascular: Normal rate and regular rhythm.   Pulmonary/Chest: Effort normal and breath sounds normal.  Abdominal: Soft. Bowel sounds are normal.  Musculoskeletal: She exhibits no edema.  Neurological: She is alert and oriented to person, place, and time.  Skin: Skin is warm and dry.  Psychiatric: She has a normal mood and affect. Her behavior is normal.   Filed Vitals:   05/04/14 1119  BP: 118/66  Pulse: 64  Temp: 98.4 F (36.9 C)  TempSrc: Oral  Resp: 12  Height: 5\' 4"  (1.626 m)  Weight: 140 lb (63.504 kg)  SpO2: 97%      Assessment & Plan:  Patient declines tetanus and flu shot today.

## 2014-05-05 NOTE — Assessment & Plan Note (Signed)
Patient declines any injections including tetanus and flu. Has had pap smear and mammogram. She is a former smoker and exercises, at healthy weight.

## 2017-06-10 ENCOUNTER — Other Ambulatory Visit: Payer: Self-pay | Admitting: Physician Assistant

## 2017-08-07 ENCOUNTER — Other Ambulatory Visit: Payer: Self-pay | Admitting: Physician Assistant

## 2017-09-04 LAB — HEPATIC FUNCTION PANEL
ALT: 20 (ref 7–35)
AST: 20 (ref 13–35)
Alkaline Phosphatase: 51 (ref 25–125)
Bilirubin, Total: 0.4

## 2017-09-04 LAB — LIPID PANEL
Cholesterol: 218 — AB (ref 0–200)
HDL: 55 (ref 35–70)
LDL Cholesterol: 132
Triglycerides: 174 — AB (ref 40–160)

## 2017-09-04 LAB — BASIC METABOLIC PANEL
BUN: 11 (ref 4–21)
CREATININE: 0.8 (ref 0.5–1.1)
GLUCOSE: 126
POTASSIUM: 4.4 (ref 3.4–5.3)
SODIUM: 138 (ref 137–147)

## 2017-09-04 LAB — VITAMIN D 25 HYDROXY (VIT D DEFICIENCY, FRACTURES): Vit D, 25-Hydroxy: 27

## 2017-09-17 ENCOUNTER — Encounter: Payer: Self-pay | Admitting: Internal Medicine

## 2017-09-17 NOTE — Progress Notes (Signed)
Abstracted and sent to scan  

## 2017-11-27 DIAGNOSIS — E039 Hypothyroidism, unspecified: Secondary | ICD-10-CM | POA: Diagnosis not present

## 2017-11-27 DIAGNOSIS — E7800 Pure hypercholesterolemia, unspecified: Secondary | ICD-10-CM | POA: Diagnosis not present

## 2017-11-27 DIAGNOSIS — R7301 Impaired fasting glucose: Secondary | ICD-10-CM | POA: Diagnosis not present

## 2018-02-25 DIAGNOSIS — E039 Hypothyroidism, unspecified: Secondary | ICD-10-CM | POA: Diagnosis not present

## 2018-02-25 DIAGNOSIS — E7800 Pure hypercholesterolemia, unspecified: Secondary | ICD-10-CM | POA: Diagnosis not present

## 2018-02-25 DIAGNOSIS — R7301 Impaired fasting glucose: Secondary | ICD-10-CM | POA: Diagnosis not present

## 2018-03-02 DIAGNOSIS — E7800 Pure hypercholesterolemia, unspecified: Secondary | ICD-10-CM | POA: Diagnosis not present

## 2018-03-02 DIAGNOSIS — E039 Hypothyroidism, unspecified: Secondary | ICD-10-CM | POA: Diagnosis not present

## 2018-03-02 DIAGNOSIS — E063 Autoimmune thyroiditis: Secondary | ICD-10-CM | POA: Diagnosis not present

## 2018-03-02 DIAGNOSIS — R7301 Impaired fasting glucose: Secondary | ICD-10-CM | POA: Diagnosis not present

## 2018-10-21 ENCOUNTER — Encounter: Payer: Self-pay | Admitting: *Deleted

## 2022-01-17 DIAGNOSIS — Z01419 Encounter for gynecological examination (general) (routine) without abnormal findings: Secondary | ICD-10-CM | POA: Diagnosis not present

## 2022-01-17 DIAGNOSIS — Z1231 Encounter for screening mammogram for malignant neoplasm of breast: Secondary | ICD-10-CM | POA: Diagnosis not present

## 2022-01-17 DIAGNOSIS — Z113 Encounter for screening for infections with a predominantly sexual mode of transmission: Secondary | ICD-10-CM | POA: Diagnosis not present

## 2022-01-17 DIAGNOSIS — Z6825 Body mass index (BMI) 25.0-25.9, adult: Secondary | ICD-10-CM | POA: Diagnosis not present

## 2022-01-17 LAB — HM MAMMOGRAPHY

## 2022-11-04 ENCOUNTER — Ambulatory Visit: Payer: BLUE CROSS/BLUE SHIELD | Admitting: Physician Assistant

## 2023-05-21 ENCOUNTER — Ambulatory Visit (INDEPENDENT_AMBULATORY_CARE_PROVIDER_SITE_OTHER): Payer: BC Managed Care – PPO | Admitting: Physician Assistant

## 2023-05-21 ENCOUNTER — Encounter: Payer: Self-pay | Admitting: Physician Assistant

## 2023-05-21 VITALS — BP 120/80 | HR 65 | Temp 97.2°F | Ht 63.5 in | Wt 147.0 lb

## 2023-05-21 DIAGNOSIS — R7989 Other specified abnormal findings of blood chemistry: Secondary | ICD-10-CM

## 2023-05-21 DIAGNOSIS — Z13220 Encounter for screening for lipoid disorders: Secondary | ICD-10-CM

## 2023-05-21 DIAGNOSIS — Z136 Encounter for screening for cardiovascular disorders: Secondary | ICD-10-CM

## 2023-05-21 DIAGNOSIS — D229 Melanocytic nevi, unspecified: Secondary | ICD-10-CM | POA: Diagnosis not present

## 2023-05-21 DIAGNOSIS — Z8632 Personal history of gestational diabetes: Secondary | ICD-10-CM

## 2023-05-21 DIAGNOSIS — Z23 Encounter for immunization: Secondary | ICD-10-CM | POA: Diagnosis not present

## 2023-05-21 DIAGNOSIS — Z0000 Encounter for general adult medical examination without abnormal findings: Secondary | ICD-10-CM | POA: Diagnosis not present

## 2023-05-21 DIAGNOSIS — Z114 Encounter for screening for human immunodeficiency virus [HIV]: Secondary | ICD-10-CM | POA: Diagnosis not present

## 2023-05-21 DIAGNOSIS — Z1159 Encounter for screening for other viral diseases: Secondary | ICD-10-CM

## 2023-05-21 DIAGNOSIS — Z1211 Encounter for screening for malignant neoplasm of colon: Secondary | ICD-10-CM

## 2023-05-21 LAB — CBC WITH DIFFERENTIAL/PLATELET
Basophils Absolute: 0 10*3/uL (ref 0.0–0.1)
Basophils Relative: 0.9 % (ref 0.0–3.0)
Eosinophils Absolute: 0.2 10*3/uL (ref 0.0–0.7)
Eosinophils Relative: 3.3 % (ref 0.0–5.0)
HCT: 41.7 % (ref 36.0–46.0)
Hemoglobin: 14 g/dL (ref 12.0–15.0)
Lymphocytes Relative: 32.8 % (ref 12.0–46.0)
Lymphs Abs: 1.5 10*3/uL (ref 0.7–4.0)
MCHC: 33.6 g/dL (ref 30.0–36.0)
MCV: 90.9 fL (ref 78.0–100.0)
Monocytes Absolute: 0.2 10*3/uL (ref 0.1–1.0)
Monocytes Relative: 5.1 % (ref 3.0–12.0)
Neutro Abs: 2.6 10*3/uL (ref 1.4–7.7)
Neutrophils Relative %: 57.9 % (ref 43.0–77.0)
Platelets: 244 10*3/uL (ref 150.0–400.0)
RBC: 4.59 Mil/uL (ref 3.87–5.11)
RDW: 13.3 % (ref 11.5–15.5)
WBC: 4.5 10*3/uL (ref 4.0–10.5)

## 2023-05-21 LAB — LIPID PANEL
Cholesterol: 267 mg/dL — ABNORMAL HIGH (ref 0–200)
HDL: 57.3 mg/dL (ref >39.00)
NonHDL: 209.38
Total CHOL/HDL Ratio: 5
Triglycerides: 414 mg/dL — ABNORMAL HIGH (ref 0.0–149.0)
VLDL: 82.8 mg/dL — ABNORMAL HIGH (ref 0.0–40.0)

## 2023-05-21 LAB — COMPREHENSIVE METABOLIC PANEL
ALT: 41 U/L — ABNORMAL HIGH (ref 0–35)
AST: 33 U/L (ref 0–37)
Albumin: 4.9 g/dL (ref 3.5–5.2)
Alkaline Phosphatase: 53 U/L (ref 39–117)
BUN: 14 mg/dL (ref 6–23)
CO2: 30 meq/L (ref 19–32)
Calcium: 9.3 mg/dL (ref 8.4–10.5)
Chloride: 100 meq/L (ref 96–112)
Creatinine, Ser: 0.63 mg/dL (ref 0.40–1.20)
GFR: 100.48 mL/min (ref >60.00)
Glucose, Bld: 134 mg/dL — ABNORMAL HIGH (ref 70–99)
Potassium: 4.3 meq/L (ref 3.5–5.1)
Sodium: 138 meq/L (ref 135–145)
Total Bilirubin: 0.4 mg/dL (ref 0.2–1.2)
Total Protein: 8 g/dL (ref 6.0–8.3)

## 2023-05-21 LAB — HEMOGLOBIN A1C: Hgb A1c MFr Bld: 6.4 % (ref 4.6–6.5)

## 2023-05-21 LAB — LDL CHOLESTEROL, DIRECT: Direct LDL: 163 mg/dL

## 2023-05-21 NOTE — Patient Instructions (Signed)
It was great to see you!  We will place referrals for dermatology and gastroenterology today to get your remaining cancer screenings UpToDate   Please go to the lab for blood work.   Our office will call you with your results unless you have chosen to receive results via MyChart.  If your blood work is normal we will follow-up each year for physicals and as scheduled for chronic medical problems.  If anything is abnormal we will treat accordingly and get you in for a follow-up.  Take care,  Lelon Mast

## 2023-05-21 NOTE — Progress Notes (Signed)
Subjective:    Heidi Lamb is a 53 y.o. female and is here for a comprehensive physical exam.  HPI  Health Maintenance Due  Topic Date Due   HIV Screening  Never done   Hepatitis C Screening  Never done   Cervical Cancer Screening (HPV/Pap Cotest)  Never done   Colonoscopy  Never done   MAMMOGRAM  11/01/2018    Acute Concerns: None   Chronic Issues: History of Hypothyroidism  Routine blood work showed an abnormal elevated TSH levels. Did not exhibit any symptoms.  She was referred to endocrinologist to follow up, blood work then did not indicate any abnormalities.  Will recheck levels today.   History of Gestational Diabetes  Does report a history of gestational diabetes during both her pregnancies which she managed with dietary changes. Denies any headaches.   Family History of Melanoma  Reports a family history of melanoma diagnosed in his 67's. Requesting a dermatology referral today.  Health Maintenance: Immunizations -- Tdap administered today Colonoscopy -- N/A Mammogram -- Last done on 11-01-18 PAP -- N/A Bone Density -- N/A Diet -- typical diet Exercise -- no typical exercise regimen, but attempts daily walks.  Sleep habits -- no complains Mood -- stable  UTD with dentist? - yes UTD with eye doctor? - yes  Weight history: Wt Readings from Last 10 Encounters:  05/21/23 147 lb (66.7 kg)  05/04/14 140 lb (63.5 kg)  09/09/11 135 lb (61.2 kg)  05/26/06 127 lb (57.6 kg)   Body mass index is 25.63 kg/m. No LMP recorded. Patient is postmenopausal.  Alcohol use:  reports current alcohol use of about 8.0 standard drinks of alcohol per week.  Tobacco use:  Tobacco Use: Medium Risk (05/21/2023)   Patient History    Smoking Tobacco Use: Former    Smokeless Tobacco Use: Never    Passive Exposure: Not on file   Eligible for lung cancer screening? no     05/21/2023   10:50 AM  Depression screen PHQ 2/9  Decreased Interest 0  Down, Depressed,  Hopeless 0  PHQ - 2 Score 0     Other providers/specialists: Patient Care Team: Jarold Motto, [place] as PCP - General (Physician Assistant)    PMHx, SurgHx, SocialHx, Medications, and Allergies were reviewed in the Visit Navigator and updated as appropriate.   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), mid chest-severe, right abdomen-severe   Irritable bowel syndrome 02/07/2007   Qualifier: Diagnosis of  By: Samara Snide       Past Surgical History:  Procedure Laterality Date   CESAREAN SECTION     VAGINAL BIRTH AFTER CESAREAN SECTION       Family History  Problem Relation Age of Onset   Hyperlipidemia Father    Hypertension Father    Cancer Father    Hypertension Brother    Alzheimer's disease Maternal Grandmother    Emphysema Maternal Grandfather    Stroke Paternal Grandmother        multiple   Heart disease Paternal Grandfather    Coronary artery disease Neg Hx    Colon cancer Neg Hx    Breast cancer Neg Hx    Diabetes Neg Hx     Social History   Tobacco Use   Smoking status: Former    Types: Cigarettes  Smokeless tobacco: Never   Tobacco comments:    Smoked cigarettes socially in college  Vaping Use   Vaping status: Never Used  Substance Use Topics   Alcohol use: Yes    Alcohol/week: 8.0 standard drinks of alcohol    Types: 2 Glasses of wine, 4 Cans of beer, 2 Standard drinks or equivalent per week    Comment: social use; no binge drinking or daily drinking   Drug use: Never    Review of Systems:   Review of Systems  Constitutional:  Negative for chills, fever, malaise/fatigue and weight loss.  HENT:  Negative for hearing loss, sinus pain and sore throat.   Respiratory:  Negative for cough and hemoptysis.   Cardiovascular:  Negative for chest pain, palpitations, leg swelling and  PND.  Gastrointestinal: Negative.  Negative for abdominal pain, constipation, diarrhea, heartburn, nausea and vomiting.  Genitourinary:  Negative for dysuria, frequency and urgency.  Musculoskeletal:  Negative for back pain, myalgias and neck pain.  Skin:  Negative for itching and rash.  Neurological:  Negative for dizziness, tingling, seizures and headaches.  Endo/Heme/Allergies:  Negative for polydipsia.  Psychiatric/Behavioral:  Negative for depression. The patient is not nervous/anxious.     Objective:   BP 120/80 (BP Location: Left Arm, Patient Position: Sitting, Cuff Size: Normal)   Pulse 65   Temp (!) 97.2 F (36.2 C) (Temporal)   Ht 5' 3.5" (1.613 m)   Wt 147 lb (66.7 kg)   SpO2 99%   BMI 25.63 kg/m  Body mass index is 25.63 kg/m.   General Appearance:    Alert, cooperative, no distress, appears stated age  Head:    Normocephalic, without obvious abnormality, atraumatic  Eyes:    PERRL, conjunctiva/corneas clear, EOM's intact, fundi    benign, both eyes  Ears:    Normal TM's and external ear canals, both ears  Nose:   Nares normal, septum midline, mucosa normal, no drainage    or sinus tenderness  Throat:   Lips, mucosa, and tongue normal; teeth and gums normal  Neck:   Supple, symmetrical, trachea midline, no adenopathy;    thyroid:  no enlargement/tenderness/nodules; no carotid   bruit or JVD  Back:     Symmetric, no curvature, ROM normal, no CVA tenderness  Lungs:     Clear to auscultation bilaterally, respirations unlabored  Chest Wall:    No tenderness or deformity   Heart:    Regular rate and rhythm, S1 and S2 normal, no murmur, rub or gallop  Breast Exam:    Deferred  Abdomen:     Soft, non-tender, bowel sounds active all four quadrants,    no masses, no organomegaly  Genitalia:    Deferred  Extremities:   Extremities normal, atraumatic, no cyanosis or edema  Pulses:   2+ and symmetric all extremities  Skin:   Skin color, texture, turgor normal, no rashes  or lesions  Lymph nodes:   Cervical, supraclavicular, and axillary nodes normal  Neurologic:   CNII-XII intact, normal strength, sensation and reflexes    throughout    Assessment/Plan:   Routine physical examination Today patient counseled on age appropriate routine health concerns for screening and prevention, each reviewed and up to date or declined. Immunizations reviewed and up to date or declined. Labs ordered and reviewed. Risk factors for depression reviewed and negative. Hearing function and visual acuity are intact. ADLs screened and addressed as needed. Functional ability and level of safety reviewed and appropriate. Education, counseling and referrals performed  based on assessed risks today. Patient provided with a copy of personalized plan for preventive services.  Hx gestational diabetes Update Hemoglobin A1c and provide recommendations   Abnormal TSH Update TSH and provide recommendations   Atypical nevi Referral to dermatology   Special screening for malignant neoplasms, colon Referral to gastroenterology   Screening for HIV (human immunodeficiency virus) Update HIV screening  Encounter for screening for other viral diseases Update hepatitis C  Encounter for lipid screening for cardiovascular disease Update lipid panel  Need for prophylactic vaccination with combined diphtheria-tetanus-pertussis (DTP) vaccine Update Tetanus, Diphtheria, and Pertussis (Tdap)     Jarold Motto, PA-C [facility],acting as a Neurosurgeon for [employer], [employer].,have documented all relevant documentation on the behalf of Jarold Motto, PA,as directed by  Jarold Motto, PA while in the presence of Jarold Motto, [place].   I, Jarold Motto, [place], have reviewed all documentation for this visit. The documentation on 05/21/23 for the exam, diagnosis, procedures, and orders are all accurate and complete.

## 2023-05-22 LAB — HEPATITIS C ANTIBODY: Hepatitis C Ab: NONREACTIVE

## 2023-05-22 LAB — HIV ANTIBODY (ROUTINE TESTING W REFLEX): HIV 1&2 Ab, 4th Generation: NONREACTIVE

## 2023-05-23 ENCOUNTER — Encounter: Payer: Self-pay | Admitting: Physician Assistant

## 2023-05-23 ENCOUNTER — Other Ambulatory Visit: Payer: Self-pay | Admitting: Physician Assistant

## 2023-05-23 DIAGNOSIS — R748 Abnormal levels of other serum enzymes: Secondary | ICD-10-CM

## 2023-06-04 ENCOUNTER — Encounter: Payer: Self-pay | Admitting: Gastroenterology

## 2023-06-12 ENCOUNTER — Other Ambulatory Visit: Payer: Self-pay

## 2023-06-12 ENCOUNTER — Ambulatory Visit: Payer: BC Managed Care – PPO

## 2023-06-12 VITALS — Ht 64.0 in | Wt 135.0 lb

## 2023-06-12 DIAGNOSIS — Z1211 Encounter for screening for malignant neoplasm of colon: Secondary | ICD-10-CM

## 2023-06-12 MED ORDER — SUFLAVE 178.7 G PO SOLR: 1.0000 | ORAL | 0 refills | Status: DC

## 2023-06-12 NOTE — Progress Notes (Signed)
 Denies allergies to eggs or soy products. Denies complication of anesthesia or sedation. Denies use of weight loss medication. Denies use of O2.   Emmi instructions given for colonoscopy.

## 2023-07-03 ENCOUNTER — Encounter: Payer: Self-pay | Admitting: Gastroenterology

## 2023-07-07 ENCOUNTER — Ambulatory Visit (AMBULATORY_SURGERY_CENTER): Payer: BC Managed Care – PPO | Admitting: Gastroenterology

## 2023-07-07 ENCOUNTER — Encounter: Payer: Self-pay | Admitting: Gastroenterology

## 2023-07-07 VITALS — BP 117/66 | HR 67 | Temp 98.2°F | Resp 17 | Ht 64.0 in | Wt 135.0 lb

## 2023-07-07 DIAGNOSIS — Z1211 Encounter for screening for malignant neoplasm of colon: Secondary | ICD-10-CM | POA: Diagnosis not present

## 2023-07-07 DIAGNOSIS — K648 Other hemorrhoids: Secondary | ICD-10-CM

## 2023-07-07 DIAGNOSIS — K621 Rectal polyp: Secondary | ICD-10-CM | POA: Diagnosis not present

## 2023-07-07 DIAGNOSIS — K644 Residual hemorrhoidal skin tags: Secondary | ICD-10-CM

## 2023-07-07 DIAGNOSIS — D128 Benign neoplasm of rectum: Secondary | ICD-10-CM

## 2023-07-07 MED ORDER — SODIUM CHLORIDE 0.9 % IV SOLN: 500.0000 mL | Freq: Once | INTRAVENOUS | Status: DC

## 2023-07-07 NOTE — Patient Instructions (Addendum)
 Resume previous diet and medications.  Repeat colonoscopy based on biopsy results.  Handout provided for polyps and hemorrhoids.   YOU HAD AN ENDOSCOPIC PROCEDURE TODAY AT THE [facility]:   Refer to the procedure report that was given to you for any specific questions about what was found during the examination.  If the procedure report does not answer your questions, please call your gastroenterologist to clarify.  If you requested that your care partner not be given the details of your procedure findings, then the procedure report has been included in a sealed envelope for you to review at your convenience later.  YOU SHOULD EXPECT: Some feelings of bloating in the abdomen. Passage of more gas than usual.  Walking can help get rid of the air that was put into your GI tract during the procedure and reduce the bloating. If you had a lower endoscopy (such as a colonoscopy or flexible sigmoidoscopy) you may notice spotting of blood in your stool or on the toilet paper. If you underwent a bowel prep for your procedure, you may not have a normal bowel movement for a few days.  Please Note:  You might notice some irritation and congestion in your nose or some drainage.  This is from the oxygen used during your procedure.  There is no need for concern and it should clear up in a day or so.  SYMPTOMS TO REPORT IMMEDIATELY:  Following upper endoscopy (EGD)  Vomiting of blood or coffee ground material  New chest pain or pain under the shoulder blades  Painful or persistently difficult swallowing  New shortness of breath  Fever of 100F or higher  Black, tarry-looking stools  For urgent or emergent issues, a gastroenterologist can be reached at any hour by calling [phone]. Do not use MyChart messaging for urgent concerns.    DIET:  We do recommend a small meal at first, but then you may proceed to your regular diet.  Drink plenty of fluids but you should avoid alcoholic beverages for  24 hours.  ACTIVITY:  You should plan to take it easy for the rest of today and you should NOT DRIVE or use heavy machinery until tomorrow (because of the sedation medicines used during the test).    FOLLOW UP: Our staff will call the number listed on your records the next business day following your procedure.  We will call around 7:15- 8:00 am to check on you and address any questions or concerns that you may have regarding the information given to you following your procedure. If we do not reach you, we will leave a message.     If any biopsies were taken you will be contacted by phone or by letter within the next 1-3 weeks.  Please call us at [phone] if you have not heard about the biopsies in 3 weeks.    SIGNATURES/CONFIDENTIALITY: You and/or your care partner have signed paperwork which will be entered into your electronic medical record.  These signatures attest to the fact that that the information above on your After Visit Summary has been reviewed and is understood.  Full responsibility of the confidentiality of this discharge information lies with you and/or your care-partner.

## 2023-07-07 NOTE — Progress Notes (Signed)
 Called to room to assist during endoscopic procedure.  Patient ID and intended procedure confirmed with present staff. Received instructions for my participation in the procedure from the performing physician.

## 2023-07-07 NOTE — Progress Notes (Signed)
 Report to PACU, RN, vss, BBS= Clear.

## 2023-07-07 NOTE — Progress Notes (Signed)
 Pt's states no medical or surgical changes since previsit or office visit.

## 2023-07-07 NOTE — Progress Notes (Signed)
  Gastroenterology History and Physical   Primary Care Physician:  Jarold Motto, [place]   Reason for Procedure:  Colorectal cancer screening  Plan:    Screening colonoscopy with possible interventions as needed     HPI: Heidi Lamb is a very pleasant 53 y.o. female here for screening colonoscopy. Denies any nausea, vomiting, abdominal pain, melena or bright red blood per rectum  The risks and benefits as well as alternatives of endoscopic procedure(s) have been discussed and reviewed. All questions answered. The patient agrees to proceed.    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 Authorizing Provider  b complex vitamins capsule Take 1 capsule by mouth daily.   Yes [provider]  Magnesium 400 MG CAPS Take 1 capsule by mouth as needed.   Yes [provider]  Probiotic Product (PROBIOTIC DAILY PO) Take 1 tablet by mouth daily in the afternoon.   Yes [provider]  Vitamin D-Vitamin K (VITAMIN K2-VITAMIN D3 PO) Take 1 capsule by mouth daily in the afternoon.   Yes [provider]    Current Outpatient Medications  Medication Sig Dispense Refill   b complex vitamins capsule Take 1 capsule by mouth daily.     Magnesium 400 MG CAPS Take 1 capsule by mouth as needed.     Probiotic Product (PROBIOTIC DAILY PO) Take 1 tablet by mouth daily in the afternoon.     Vitamin D-Vitamin K (VITAMIN K2-VITAMIN D3 PO) Take 1 capsule by mouth daily in the  afternoon.     Current Facility-Administered Medications  Medication Dose Route Frequency Provider Last Rate Last Admin   0.9 %  sodium chloride infusion  500 mL Intravenous Once Napoleon Form, MD        Allergies as of 07/07/2023   (No Known Allergies)    Family History  Problem Relation Age of Onset   Hyperlipidemia Father    Hypertension Father    Cancer Father    Hypertension Brother    Alzheimer's disease Maternal Grandmother    Emphysema Maternal Grandfather    Stroke Paternal Grandmother        multiple   Heart disease Paternal Grandfather    Coronary artery disease Neg Hx    Colon cancer Neg Hx    Breast cancer Neg Hx    Diabetes Neg Hx    Esophageal cancer Neg Hx    Rectal cancer Neg Hx    Stomach cancer Neg Hx     Social History   Socioeconomic History   Marital status: Married    Spouse name: Not on file   Number of children: 2   Years of education: 16   Highest education level: Bachelor's degree (e.g., BA, AB, BS)  Occupational History    Employer: [employer]  Tobacco Use   Smoking status: Former    Types: Cigarettes   Smokeless tobacco: Never   Tobacco comments:    Smoked cigarettes  socially in college  Vaping Use   Vaping status: Never Used  Substance and Sexual Activity   Alcohol use: Yes    Alcohol/week: 8.0 standard drinks of alcohol    Types: 2 Glasses of wine, 4 Cans of beer, 2 Standard drinks or equivalent per week    Comment: social use; no binge drinking or daily drinking   Drug use: Never   Sexual activity: Yes    Partners: Male    Birth control/protection: Post-menopausal  Other Topics Concern   Not on file  Social History Narrative   [employer] -- management   Has a boyfriend of 6 months   Social Drivers of Corporate investment banker Strain: Low Risk  (05/20/2023)   Overall Financial Resource Strain (CARDIA)    Difficulty of Paying Living Expenses: Not hard at all  Food Insecurity: No Food Insecurity  (05/20/2023)   Hunger Vital Sign    Worried About Running Out of Food in the Last Year: Never true    Ran Out of Food in the Last Year: Never true  Transportation Needs: No Transportation Needs (05/20/2023)   PRAPARE - Administrator, Civil Service (Medical): No    Lack of Transportation (Non-Medical): No  Physical Activity: Sufficiently Active (05/20/2023)   Exercise Vital Sign    Days of Exercise per Week: 5 days    Minutes of Exercise per Session: 40 min  Stress: No Stress Concern Present (05/20/2023)   [employer] of Occupational Health - Occupational Stress Questionnaire    Feeling of Stress : Only a little  Social Connections: Moderately Integrated (05/20/2023)   Social Connection and Isolation Panel [NHANES]    Frequency of Communication with Friends and Family: More than three times a week    Frequency of Social Gatherings with Friends and Family: More than three times a week    Attends Religious Services: 1 to 4 times per year    Active Member of [employer] or Organizations: Yes    Attends Engineer, structural: More than 4 times per year    Marital Status: Separated  Intimate Partner Violence: Not on file    Review of Systems:  All other review of systems negative except as mentioned in the HPI.  Physical Exam: Vital signs in last 24 hours: BP (!) 152/91   Pulse 73   Temp 98.2 F (36.8 C)   Ht 5\' 4"  (1.626 m)   Wt 135 lb (61.2 kg)   LMP 08/12/2011   SpO2 99%   BMI 23.17 kg/m  General:   Alert, NAD Lungs:  Clear .   Heart:  Regular rate and rhythm Abdomen:  Soft, nontender and nondistended. Neuro/Psych:  Alert and cooperative. Normal mood and affect. A and O x 3  Reviewed labs, radiology imaging, old records and pertinent past GI work up  Patient is appropriate for planned procedure(s) and anesthesia in an ambulatory setting   K. Scherry Ran , MD [phone]

## 2023-07-07 NOTE — Op Note (Signed)
 [facility] Patient Name: Heidi Lamb Procedure Date: 07/07/2023 1:23 PM MRN: [id number] Endoscopist: Napoleon Form , MD, [id number] Age: 53 Referring MD:  Date of Birth: 02-Jun-1970 Gender: Female Account #: [id number] Procedure:                Colonoscopy Indications:              Screening for colorectal malignant neoplasm Medicines:                Monitored Anesthesia Care Procedure:                Pre-Anesthesia Assessment:                           - Prior to the procedure, a History and Physical                            was performed, and patient medications and                            allergies were reviewed. The patient's tolerance of                            previous anesthesia was also reviewed. The risks                            and benefits of the procedure and the sedation                            options and risks were discussed with the patient.                            All questions were answered, and informed consent                            was obtained. Prior Anticoagulants: The patient has                            taken no anticoagulant or antiplatelet agents. ASA                            Grade Assessment: I - A normal, healthy patient.                            After reviewing the risks and benefits, the patient                            was deemed in satisfactory condition to undergo the                            procedure.                           After obtaining informed consent, the colonoscope  was passed under direct vision. Throughout the                            procedure, the patient's blood pressure, pulse, and                            oxygen saturations were monitored continuously. The                            Olympus Scope [id number] was introduced through the                            anus and advanced to the the cecum, identified by                            appendiceal orifice and  ileocecal valve. The                            colonoscopy was performed without difficulty. The                            patient tolerated the procedure well. The quality                            of the bowel preparation was good. The ileocecal                            valve, appendiceal orifice, and rectum were                            photographed. Scope In: 1:37:57 PM Scope Out: 1:54:54 PM Scope Withdrawal Time: 0 hours 11 minutes 46 seconds  Total Procedure Duration: 0 hours 16 minutes 57 seconds  Findings:                 The perianal and digital rectal examinations were                            normal.                           A 1 mm polyp was found in the rectum. The polyp was                            sessile. The polyp was removed with a cold biopsy                            forceps. Resection and retrieval were complete.                           Non-bleeding external and internal hemorrhoids were                            found during retroflexion. The hemorrhoids were  small. Complications:            No immediate complications. Estimated Blood Loss:     Estimated blood loss was minimal. Impression:               - One 1 mm polyp in the rectum, removed with a cold                            biopsy forceps. Resected and retrieved.                           - Non-bleeding external and internal hemorrhoids. Recommendation:           - Patient has a contact number available for                            emergencies. The signs and symptoms of potential                            delayed complications were discussed with the                            patient. Return to normal activities tomorrow.                            Written discharge instructions were provided to the                            patient.                           - Resume previous diet.                           - Continue present medications.                            - Await pathology results.                           - Repeat colonoscopy in 7-10 years for surveillance                            based on pathology results. Napoleon Form, MD 07/07/2023 2:01:40 PM This report has been signed electronically.

## 2023-07-08 ENCOUNTER — Telehealth: Payer: Self-pay

## 2023-07-08 NOTE — Telephone Encounter (Signed)
 Left message on follow up call.

## 2023-07-10 LAB — SURGICAL PATHOLOGY

## 2023-07-14 ENCOUNTER — Ambulatory Visit: Payer: BC Managed Care – PPO | Admitting: Dermatology

## 2023-08-21 ENCOUNTER — Ambulatory Visit: Payer: Self-pay | Admitting: Gastroenterology

## 2023-09-02 ENCOUNTER — Ambulatory Visit: Admitting: Dermatology

## 2023-09-03 ENCOUNTER — Ambulatory Visit: Admitting: Dermatology

## 2024-04-23 ENCOUNTER — Other Ambulatory Visit: Payer: Self-pay | Admitting: Medical Genetics

## 2024-04-30 ENCOUNTER — Ambulatory Visit (INDEPENDENT_AMBULATORY_CARE_PROVIDER_SITE_OTHER): Admitting: Physician Assistant

## 2024-04-30 ENCOUNTER — Encounter: Payer: Self-pay | Admitting: Physician Assistant

## 2024-04-30 VITALS — BP 146/92 | HR 70 | Temp 97.2°F | Ht 63.5 in | Wt 144.0 lb

## 2024-04-30 DIAGNOSIS — Z0000 Encounter for general adult medical examination without abnormal findings: Secondary | ICD-10-CM

## 2024-04-30 DIAGNOSIS — F418 Other specified anxiety disorders: Secondary | ICD-10-CM

## 2024-04-30 DIAGNOSIS — R7989 Other specified abnormal findings of blood chemistry: Secondary | ICD-10-CM

## 2024-04-30 DIAGNOSIS — E88819 Insulin resistance, unspecified: Secondary | ICD-10-CM

## 2024-04-30 DIAGNOSIS — R030 Elevated blood-pressure reading, without diagnosis of hypertension: Secondary | ICD-10-CM | POA: Diagnosis not present

## 2024-04-30 NOTE — Progress Notes (Signed)
 "  Subjective:    Heidi Lamb is a 54 y.o. female and is here for a comprehensive physical exam.  HPI  There are no preventive care reminders to display for this patient.  Discussed the use of AI scribe software for clinical note transcription with the patient, who gave verbal consent to proceed.  History of Present Illness   Heidi Lamb is a 54 year old female who presents for a follow-up visit.  She has no new concerns. She recently had a colonoscopy without complications. She enrolled in a gene study but is hesitant due to prior protocol issues.  Her father died from melanoma with lung and brain metastases in his late 10s. Her mother is healthy.  She rarely drinks alcohol socially and has not smoked since college. She wants to improve diet and exercise after recent illnesses, including travelers diarrhea in December and a prolonged flu-like illness. 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  She sleeps well and describes her mental health as pretty good despite work stress. She takes no medications, only supplements. She has IBS but has been asymptomatic for years with dietary changes.  She denies unexplained headaches, joint pain, or leg swelling. She is up to date with dental and eye care.      Health Maintenance: Immunizations -- UpToDate  Colonoscopy -- UpToDate  Mammogram -- UpToDate -- requesting records PAP -- UpToDate -- requesting records Bone Density -- N/A  Diet -- overall healthy Exercise -- as schedule and health permits  Sleep habits -- no major concerns Mood -- stress  UTD with dentist? - yes UTD with eye doctor? - yes  Weight history: Wt Readings from Last 10 Encounters:  04/30/24 144 lb (65.3 kg)  07/07/23 135 lb (61.2 kg)  06/12/23 135 lb (61.2 kg)  05/21/23 147 lb (66.7 kg)  05/04/14 140 lb (63.5 kg)  09/09/11 135 lb (61.2 kg)  05/26/06 127 lb (57.6 kg)   Body mass index is 25.11 kg/m. Patient's  last menstrual period was 08/12/2011.  Alcohol use:  reports current alcohol use of about 8.0 standard drinks of alcohol per week.  Tobacco use:  Tobacco Use: Medium Risk (04/30/2024)   Patient History    Smoking Tobacco Use: Former    Smokeless Tobacco Use: Never    Passive Exposure: Not on file   Eligible for lung cancer screening? no     04/30/2024   10:04 AM  Depression screen PHQ 2/9  Decreased Interest 0  Down, Depressed, Hopeless 0  PHQ - 2 Score 0     Other providers/specialists: Patient Care Team: Job Lukes, [place] as PCP - General (Physician Assistant) [place], [facility] as Consulting Physician (Gynecology)    PMHx, SurgHx, SocialHx, Medications, and Allergies were reviewed in the Visit Navigator and updated as appropriate.   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), mid chest-severe, right abdomen-severe   Irritable bowel syndrome 02/07/2007   Qualifier: Diagnosis of  By: Wilhemina SHARALYN Aspen       Past Surgical History:  Procedure Laterality Date   CESAREAN SECTION     VAGINAL BIRTH AFTER CESAREAN SECTION       Family History  Problem Relation Age of Onset   Hyperlipidemia Father  Hypertension Father    Melanoma Father        mets to brain   Hypertension Brother    Alzheimer's disease Maternal Grandmother    Emphysema Maternal Grandfather    Stroke Paternal Grandmother        multiple   Heart disease Paternal Grandfather    Coronary artery disease Neg Hx    Colon cancer Neg Hx    Breast cancer Neg Hx    Diabetes Neg Hx    Esophageal cancer Neg Hx    Rectal cancer Neg Hx    Stomach cancer Neg Hx     Social History[1]  Review of Systems:   Review of Systems  Constitutional:  Negative for chills, fever, malaise/fatigue and weight loss.  HENT:  Negative for  hearing loss, sinus pain and sore throat.   Respiratory:  Negative for cough and hemoptysis.   Cardiovascular:  Negative for chest pain, palpitations, leg swelling and PND.  Gastrointestinal:  Negative for abdominal pain, constipation, diarrhea, heartburn, nausea and vomiting.  Genitourinary:  Negative for dysuria, frequency and urgency.  Musculoskeletal:  Negative for back pain, myalgias and neck pain.  Skin:  Negative for itching and rash.  Neurological:  Negative for dizziness, tingling, seizures and headaches.  Endo/Heme/Allergies:  Negative for polydipsia.  Psychiatric/Behavioral:  Negative for depression. The patient is not nervous/anxious.     Objective:   BP (!) 140/90 (BP Location: Left Arm, Patient Position: Sitting, Cuff Size: Normal)   Pulse 70   Temp (!) 97.2 F (36.2 C) (Temporal)   Ht 5' 3.5 (1.613 m)   Wt 144 lb (65.3 kg)   LMP 08/12/2011   SpO2 99%   BMI 25.11 kg/m  Body mass index is 25.11 kg/m.   General Appearance:    Alert, cooperative, no distress, appears stated age  Head:    Normocephalic, without obvious abnormality, atraumatic  Eyes:    PERRL, conjunctiva/corneas clear, EOM's intact, fundi    benign, both eyes  Ears:    Normal TM's and external ear canals, both ears  Nose:   Nares normal, septum midline, mucosa normal, no drainage    or sinus tenderness  Throat:   Lips, mucosa, and tongue normal; teeth and gums normal  Neck:   Supple, symmetrical, trachea midline, no adenopathy;    thyroid :  no enlargement/tenderness/nodules; no carotid   bruit or JVD  Back:     Symmetric, no curvature, ROM normal, no CVA tenderness  Lungs:     Clear to auscultation bilaterally, respirations unlabored  Chest Wall:    No tenderness or deformity   Heart:    Regular rate and rhythm, S1 and S2 normal, no murmur, rub or gallop  Breast Exam:    Deferred No tenderness, masses, or nipple abnormality  Abdomen:     Soft, non-tender, bowel sounds active all four quadrants,     no masses, no organomegaly  Genitalia:    Deferred  Extremities:   Extremities normal, atraumatic, no cyanosis or edema  Pulses:   2+ and symmetric all extremities  Skin:   Skin color, texture, turgor normal, no rashes or lesions  Lymph nodes:   Cervical, supraclavicular, and axillary nodes normal  Neurologic:   CNII-XII intact, normal strength, sensation and reflexes    throughout    Assessment/Plan:   Assessment and Plan    General Health Maintenance Today patient counseled on age appropriate routine health concerns for screening and prevention, each reviewed and up to date or declined. Immunizations  reviewed and up to date or declined. Labs ordered and reviewed. Risk factors for depression reviewed and negative. Hearing function and visual acuity are intact. ADLs screened and addressed as needed. Functional ability and level of safety reviewed and appropriate. Education, counseling and referrals performed based on assessed risks today. Patient provided with a copy of personalized plan for preventive services.  Elevated blood pressure reading Blood pressure elevated, likely due to work-related stress. The patient is not taking antihypertensive medications. - Rechecked blood pressure today. - Monitor blood pressure at home and report if consistently elevated.  Insulin resistance Blood sugars increasing. She acknowledged need for diet and exercise improvement. - Rechecked blood sugars today. - Encouraged improved diet and exercise habits.  Abnormal TSH Previous elevated thyroid  test, follow-up normal. No symptoms reported. - Will order TSH  Situational anxiety Reports there is no need for intervention at this time; continue to monitor closely with low threshold to follow up   Lucie Buttner, PA-C [facility]           [1]  Social History Tobacco Use   Smoking status: Former    Types: Cigarettes   Smokeless tobacco: Never   Tobacco comments:     Smoked cigarettes socially in college  Vaping Use   Vaping status: Never Used  Substance Use Topics   Alcohol use: Yes    Alcohol/week: 8.0 standard drinks of alcohol    Types: 2 Glasses of wine, 4 Cans of beer, 2 Standard drinks or equivalent per week    Comment: social use; no binge drinking or daily drinking   Drug use: Never   "

## 2024-05-14 ENCOUNTER — Other Ambulatory Visit
